# Patient Record
Sex: Female | Born: 1975 | Race: White | Hispanic: No | Marital: Married
Health system: Southern US, Community
[De-identification: ages and names within clinical notes are randomized; demographics above are authoritative.]

## PROBLEM LIST (undated history)

## (undated) DIAGNOSIS — G43909 Migraine, unspecified, not intractable, without status migrainosus: Secondary | ICD-10-CM

---

## 2003-11-11 ENCOUNTER — Observation Stay (HOSPITAL_COMMUNITY): Admission: AD | Admit: 2003-11-11 | Discharge: 2003-11-12 | Payer: Self-pay | Admitting: Otolaryngology

## 2004-10-05 ENCOUNTER — Encounter: Admission: RE | Admit: 2004-10-05 | Discharge: 2004-10-05 | Payer: Self-pay | Admitting: Obstetrics and Gynecology

## 2006-01-23 ENCOUNTER — Other Ambulatory Visit: Admission: RE | Admit: 2006-01-23 | Discharge: 2006-01-23 | Payer: Self-pay | Admitting: Obstetrics and Gynecology

## 2006-11-21 IMAGING — US US MD EVAL AND MANAGEMENT - NRPT [facility]
1 series · 13 of 16 positions shown · non-contrast
Comparison: none

[Series 1: unknown · 13 of 42 slices shown]
[im 1/42]
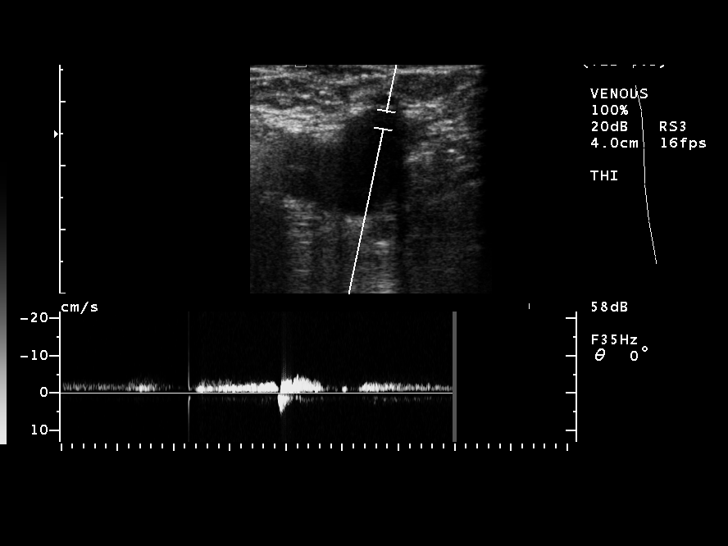
[im 3/42]
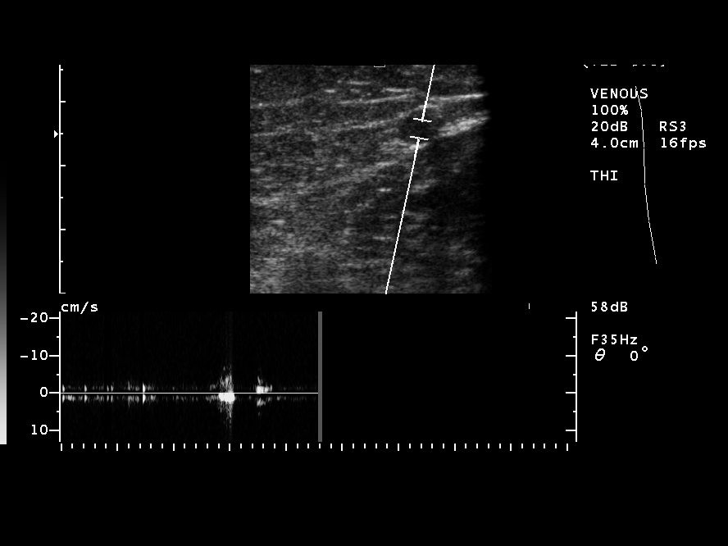
[im 9/42]
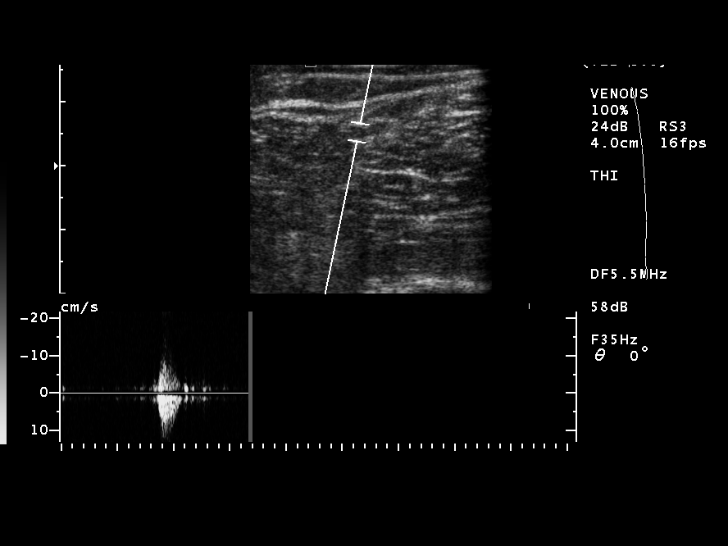
[im 11/42]
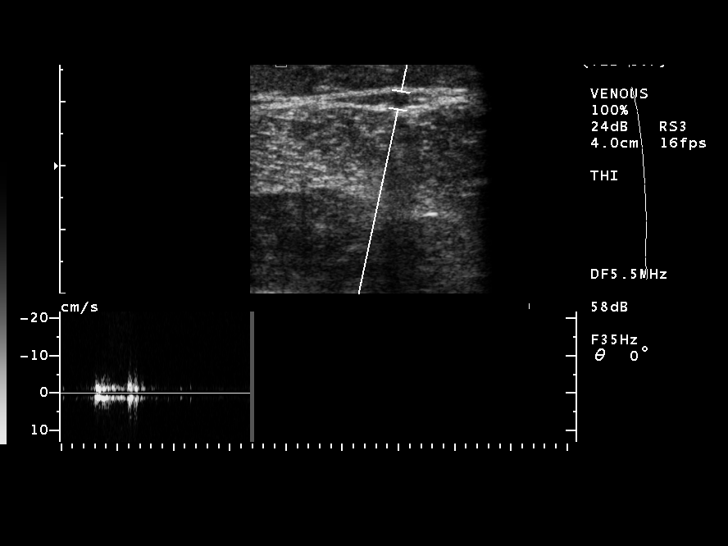
[im 14/42]
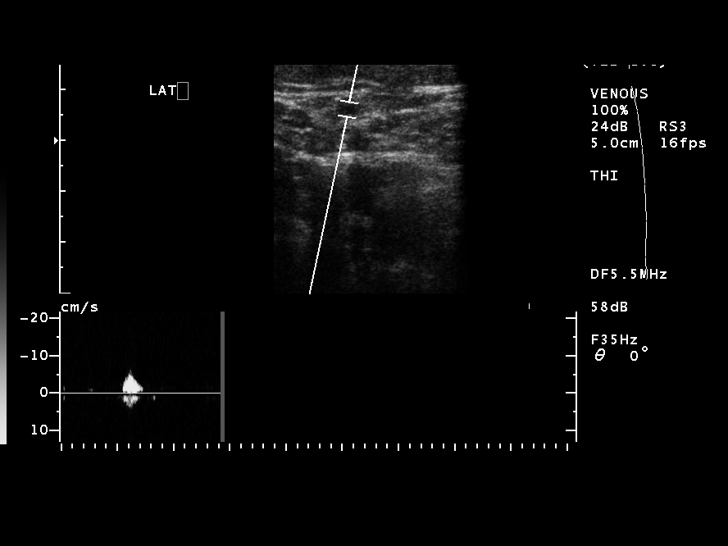
[im 17/42]
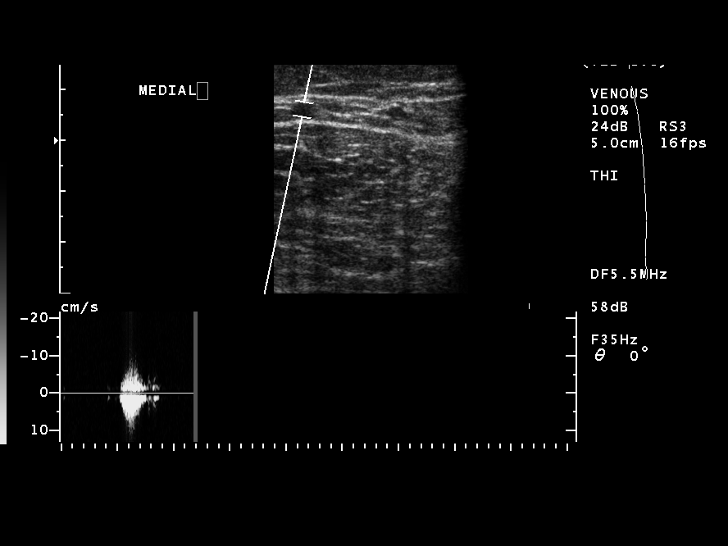
[im 22/42]
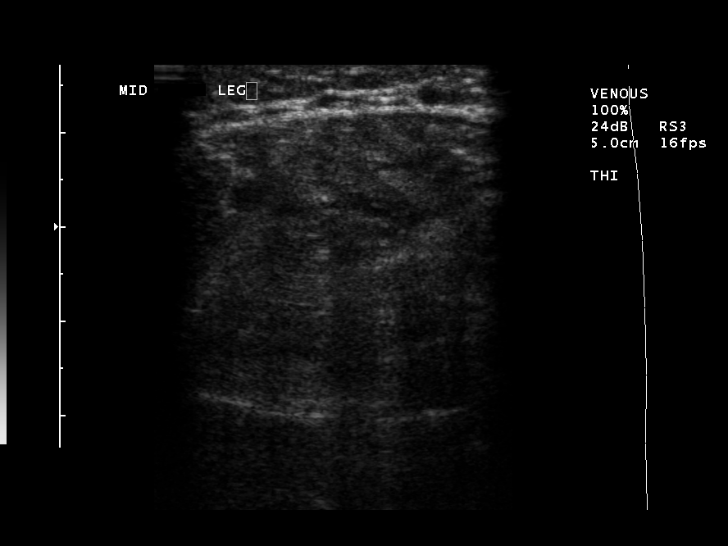
[im 25/42]
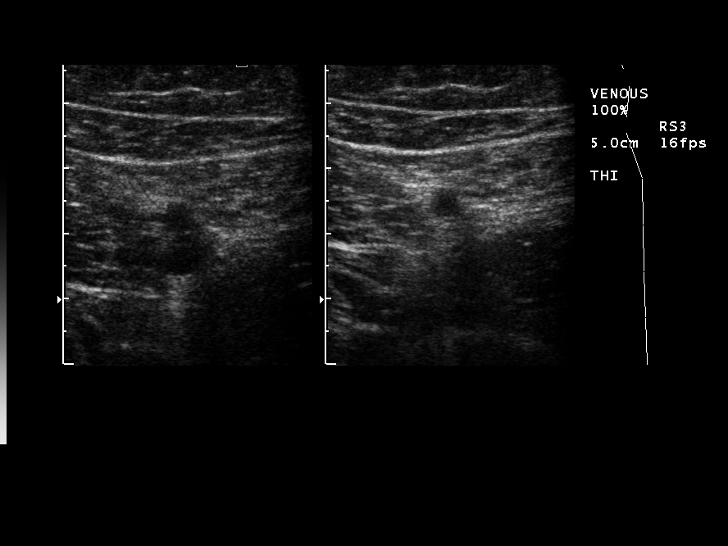
[im 28/42]
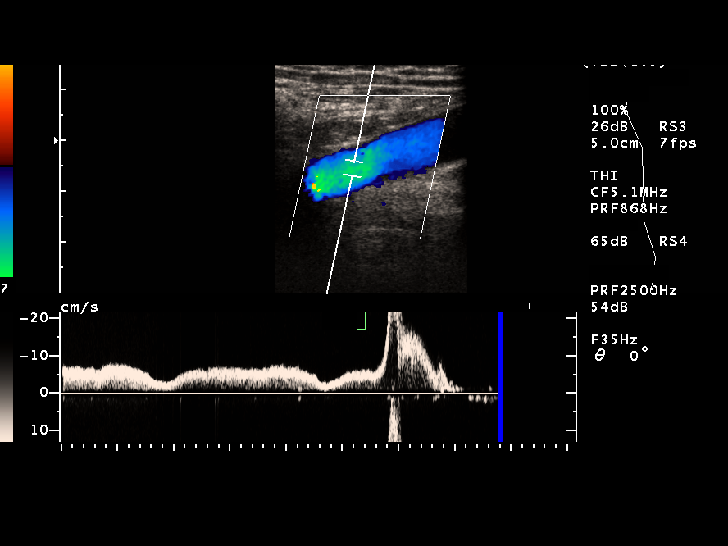
[im 31/42]
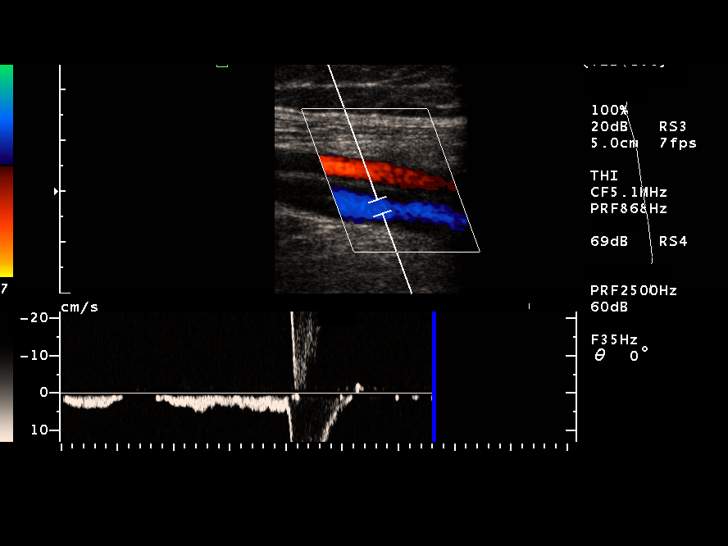
[im 33/42]
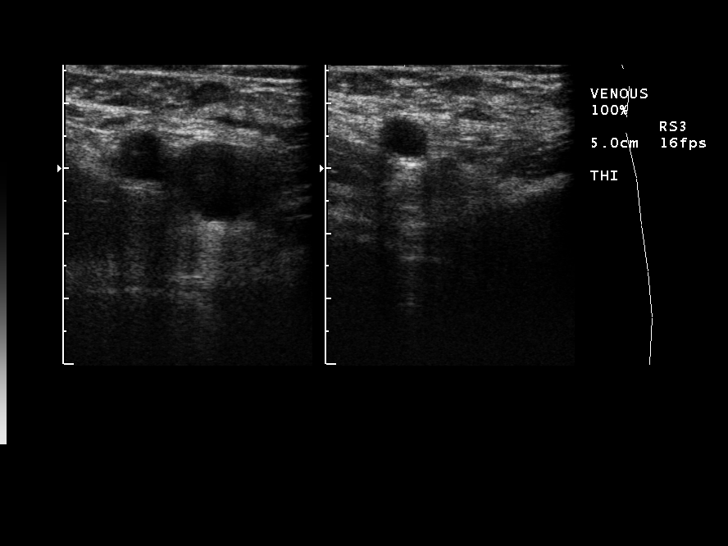
[im 39/42]
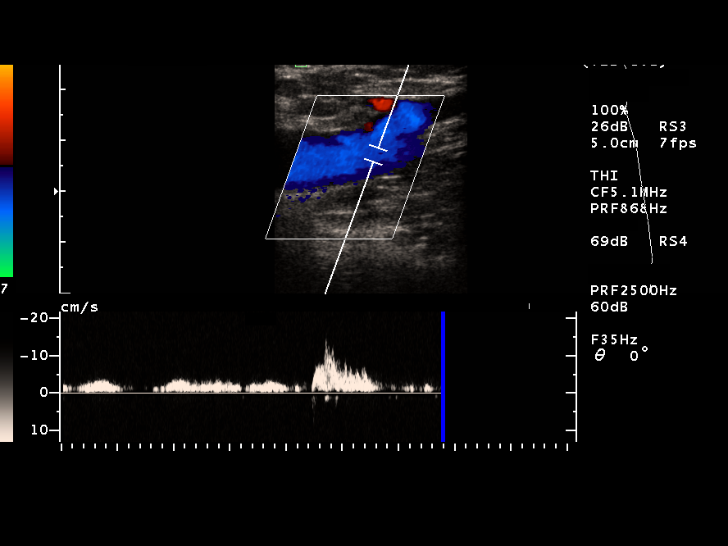
[im 42/42]
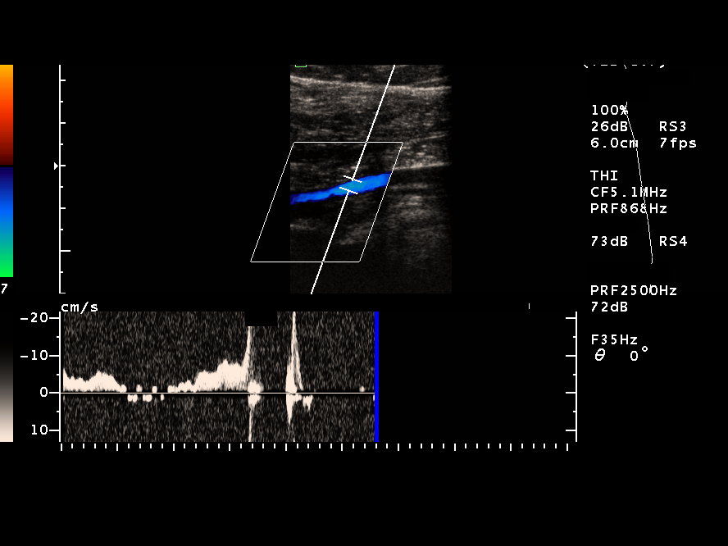

[13 of 16 positions shown; findings below may reference images not displayed]

OUTPATIENT CONSULTATION ? LEVEL II ? [DATE]
 Erick W Nery, M.D.
 [REDACTED]
 RE:  Savio Locklear (DOB ? [birth year])
 Dear Dr. Tomek Momin:  
 Thank you for your referral of Ms. Ardito to me for evaluation of possible lower extremity venous disease.  As you know, Ms. Robbin is a 29 year old female, who describes worsening pain within her calves bilaterally, left worse than right.  The patient?s job requires her to stand for long periods of time, and the patient states that her symptoms worsen throughout the day.  These symptoms have been worsening over the last several months.  The patient denies leg swelling.
 On physical exam, only two small spider veins are noted on the posterior left thigh and calf.  No varicosity is noted in either leg.  
 Due to the patient?s calf pain, I felt it was important to exclude venous disease as a possible cause.  Therefore, the patient underwent bilateral lower extremity venous ultrasound.  This was dictated in a separate report.  In short, this shows no evidence of venous disease.  There is no evidence of reflux or thrombosis. 
 I discussed these results with the patient, and insured her that venous disease is not the cause of her pain.  Therefore, no treatment directed at the patient?s veins is necessary at this time.  Certainly, if the patient feels that her spider veins are a cosmetic issue, these can be treated with sclerotherapy. 
 Again thank you for your referral of Ms. Robbin to me for evaluation.  If I can be of further assistance in the future, please do not hesitate to contact me. 
 I spent approximately 20 minutes in direct consultation with the patient. 
 Sincerely,
 KGD:chc

## 2007-01-28 ENCOUNTER — Other Ambulatory Visit: Admission: RE | Admit: 2007-01-28 | Discharge: 2007-01-28 | Payer: Self-pay | Admitting: Obstetrics and Gynecology

## 2008-02-05 ENCOUNTER — Other Ambulatory Visit: Admission: RE | Admit: 2008-02-05 | Discharge: 2008-02-05 | Payer: Self-pay | Admitting: Obstetrics and Gynecology

## 2008-03-31 ENCOUNTER — Ambulatory Visit (HOSPITAL_COMMUNITY): Admission: RE | Admit: 2008-03-31 | Discharge: 2008-03-31 | Payer: Self-pay | Admitting: Obstetrics and Gynecology

## 2009-02-22 ENCOUNTER — Other Ambulatory Visit: Admission: RE | Admit: 2009-02-22 | Discharge: 2009-02-22 | Payer: Self-pay | Admitting: Obstetrics and Gynecology

## 2010-05-17 IMAGING — RF DG HYSTEROGRAM
3 series · 3 of 3 positions shown · non-contrast
Comparison: none

CLINICAL DATA: Status post Essure micro-insert placement for
contraception.  Evaluate micro-insert location and tubal patency.

HYSTEROSALPINGOGRAM
TECHNIQUE: Hysterosalpingogram was performed by the ordering
physician.  Fluoroscopic images were submitted for interpretation
following the procedure.
Fluoroscopy time: 0.4 minutes

[Series 1: run · 1 of 1 slices shown (1 of 3)]
[im 1/1]
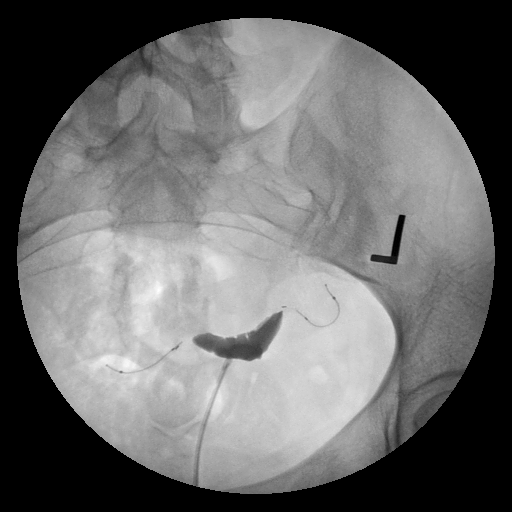

[Series 2: run · 1 of 1 slices shown (2 of 3)]
[im 1/1]
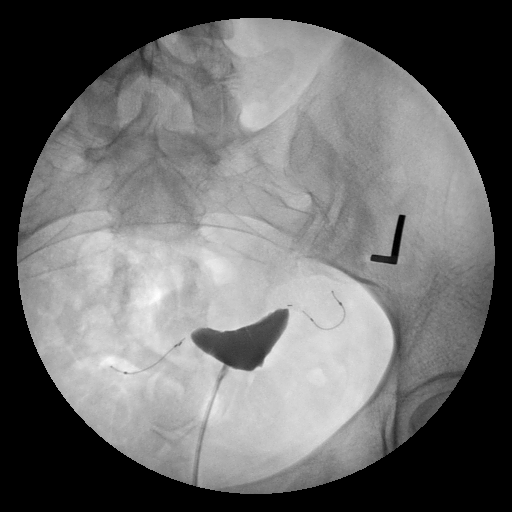

[Series 3: run · 1 of 1 slices shown (3 of 3)]
[im 1/1]
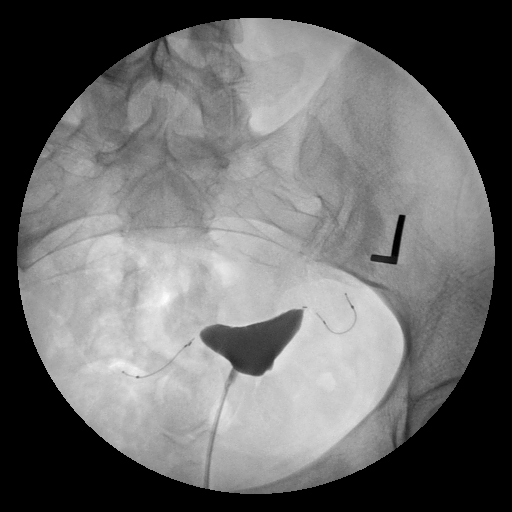

[3 of 3 positions shown; findings below may reference images not displayed]

FINDINGS: The Essure microinserts are seen in satisfactory
location within both of the fallopian tubes.

No contrast is seen opacifying either fallopian tube.  Both
fallopian tubes are occluded at the cornua (category 1).
IMPRESSION: Both Essure micro-inserts are in satisfactory location, and both
fallopian tubes are occluded at the cornua (category 1).

## 2010-05-23 ENCOUNTER — Other Ambulatory Visit (HOSPITAL_COMMUNITY)
Admission: RE | Admit: 2010-05-23 | Discharge: 2010-05-23 | Disposition: A | Discharge status: Home / Self Care | Payer: PRIVATE HEALTH INSURANCE | Source: Ambulatory Visit | Attending: Obstetrics and Gynecology | Admitting: Obstetrics and Gynecology

## 2010-05-23 ENCOUNTER — Other Ambulatory Visit: Payer: Self-pay | Admitting: Nurse Practitioner

## 2010-05-23 DIAGNOSIS — Z1159 Encounter for screening for other viral diseases: Secondary | ICD-10-CM | POA: Insufficient documentation

## 2010-05-23 DIAGNOSIS — Z01419 Encounter for gynecological examination (general) (routine) without abnormal findings: Secondary | ICD-10-CM | POA: Insufficient documentation

## 2015-09-28 ENCOUNTER — Ambulatory Visit (HOSPITAL_COMMUNITY)
Admission: RE | Admit: 2015-09-28 | Discharge: 2015-09-28 | Disposition: A | Discharge status: Home / Self Care | Payer: PRIVATE HEALTH INSURANCE | Attending: Psychiatry | Admitting: Psychiatry

## 2015-09-28 ENCOUNTER — Encounter (HOSPITAL_COMMUNITY): Payer: Self-pay | Admitting: Emergency Medicine

## 2015-09-28 DIAGNOSIS — F411 Generalized anxiety disorder: Secondary | ICD-10-CM | POA: Diagnosis present

## 2015-09-28 DIAGNOSIS — G43909 Migraine, unspecified, not intractable, without status migrainosus: Secondary | ICD-10-CM | POA: Insufficient documentation

## 2015-09-28 HISTORY — DX: Migraine, unspecified, not intractable, without status migrainosus: G43.909

## 2015-09-28 NOTE — BH Assessment (Addendum)
Assessment Note  Adrienne Garza is a 40 y.o. female who [facility] due to "severe anxiety and depression". Pt reports having relational issues with her 38 yr old stepdaughter and not being able to be settled in the fact that they have relational issues. Pt denies SI, HI, AVH or any hx of either. Pt denies any drug/alcohol use or psych hx. Pt is referred for OP therapy/psychiatry.   Diagnosis: GAD  Past Medical History:  Past Medical History:  Diagnosis Date  . Migraines     No past surgical history on file.  Family History: No family history on file.  Social History:  reports that she does not use drugs. Her tobacco and alcohol histories are not on file.  Additional Social History:  Alcohol / Drug Use Pain Medications: see PTA meds Prescriptions: see PTA meds Over the Counter: see PTA meds History of alcohol / drug use?: No history of alcohol / drug abuse  CIWA:   COWS:    Allergies: Allergies not on file  Home Medications:  (Not in a hospital admission)  OB/GYN Status:  No LMP recorded.  General Assessment Data [facility] TTS Assessment: In system Is this a Tele or Face-to-Face Assessment?: Face-to-Face Is this an Initial Assessment or a Re-assessment for this encounter?: Initial Assessment Marital status: Married Midway[place]iden name: Genis Is patient pregnant?: No Pregnancy Status: No Living Arrangements: Spouse/significant other, Other relatives (step-daughter) Can pt return to current living arrangement?: Yes Admission Status: Voluntary Is patient capable of signing voluntary admission?: Yes Referral Source: Self/Family/Friend Insurance type: [employer]  Medical Screening Exam [facility] Walk[facility] ONLY) Medical Exam completed: Yes  Crisis Care Plan Living Arrangements: Spouse/significant other, Other relatives (step-daughter) Name of Psychiatrist: none Name of Therapist: none  Education Status Is patient  currently in school?: No  Risk to self with the past 6 months Suicidal Ideation: No Has patient been a risk to self within the past 6 months prior to admission? : No Suicidal Intent: No Has patient had any suicidal intent within the past 6 months prior to admission? : No Is patient at risk for suicide?: No Suicidal Plan?: No Has patient had any suicidal plan within the past 6 months prior to admission? : No Access to Means: No What has been your use of drugs/alcohol within the last 12 months?: pt denies Previous Attempts/Gestures: No Intentional Self Injurious Behavior: None Family Suicide History: Unknown Recent stressful life event(s): Conflict (Comment) (w/ stepdaughter) Persecutory voices/beliefs?: No Depression: Yes Depression Symptoms: Tearfulness, Isolating, Feeling angry/irritable Substance abuse history and/or treatment for substance abuse?: No Suicide prevention information given to non-admitted patients: Not applicable  Risk to Others within the past 6 months Homicidal Ideation: No Does patient have any lifetime risk of violence toward others beyond the six months prior to admission? : No Thoughts of Harm to Others: No Current Homicidal Intent: No Current Homicidal Plan: No Access to Homicidal Means: No History of harm to others?: No Assessment of Violence: None Noted Does patient have access to weapons?: No Criminal Charges Pending?: No Does patient have a court date: No Is patient on probation?: No  Psychosis Hallucinations: None noted Delusions: None noted  Mental Status Report Appearance/Hygiene: Unremarkable Eye Contact: Fair Motor Activity: Unremarkable Speech: Logical/coherent Level of Consciousness: Crying, Alert Mood: Anxious, Sad Affect: Appropriate to circumstance Anxiety Level: Panic Attacks Panic attack frequency: 2 or 3xs/day Most recent panic attack: this morning (09/28/2015) Thought Processes: Coherent, Relevant Judgement:  Unimpaired  Orientation: Person, Time, Situation, Place, Appropriate for developmental age Obsessive Compulsive Thoughts/Behaviors: None  Cognitive Functioning Concentration: Normal Memory: Recent Intact, Remote Intact IQ: Average Insight: Good Impulse Control: Good Appetite: Poor Sleep: Decreased Total Hours of Sleep: 2 Vegetative Symptoms: None  ADLScreening [facility] Assessment Services) Patient's cognitive ability adequate to safely complete daily activities?: Yes Patient able to express need for assistance with ADLs?: Yes Independently performs ADLs?: Yes (appropriate for developmental age)  Prior Inpatient Therapy Prior Inpatient Therapy: No  Prior Outpatient Therapy Prior Outpatient Therapy: No Does patient have an ACCT team?: No Does patient have Intensive In-House Services?  : No Does patient have [facility] services? : No Does patient have P4CC services?: No  ADL Screening (condition at time of admission) Patient's cognitive ability adequate to safely complete daily activities?: Yes Is the patient deaf or have difficulty hearing?: No Does the patient have difficulty seeing, even when wearing glasses/contacts?: No Does the patient have difficulty concentrating, remembering, or making decisions?: No Patient able to express need for assistance with ADLs?: Yes Does the patient have difficulty dressing or bathing?: No Independently performs ADLs?: Yes (appropriate for developmental age) Does the patient have difficulty walking or climbing stairs?: No Weakness of Legs: None Weakness of Arms/Hands: None  Home Assistive Devices/Equipment Home Assistive Devices/Equipment: None  Therapy Consults (therapy consults require a physician order) PT Evaluation Needed: No OT Evalulation Needed: No SLP Evaluation Needed: No Abuse/Neglect Assessment (Assessment to be complete while patient is alone) Physical Abuse: Denies Verbal Abuse: Denies Sexual Abuse: Denies Exploitation of  patient/patient's resources: Denies Self-Neglect: Denies Values / Beliefs Cultural Requests During Hospitalization: None Spiritual Requests During Hospitalization: None Consults Spiritual Care Consult Needed: No Social Work Consult Needed: No Merchant navy officer (For Healthcare) Does patient have an advance directive?: No Would patient like information on creating an advanced directive?: No - patient declined information    Additional Information 1:1 In Past 12 Months?: No CIRT Risk: No Elopement Risk: No Does patient have medical clearance?: Yes     Disposition:  Disposition Initial Assessment Completed for this Encounter: Yes (consulted with Malachy Chamber, NP) Disposition of Patient: Other dispositions (Pt given resources for OP therapy and psychiatry) Other disposition(s): Information only  On Site Evaluation by:   Reviewed with Physician:    Laddie Aquas 09/28/2015 11:37 AM

## 2015-09-28 NOTE — H&P (Signed)
Behavioral Health Medical Screening Exam  Adrienne Garza is an 40 y.o. female.  Total Time spent with patient: 20 minutes  Psychiatric Specialty Exam: Physical Exam  Nursing note and vitals reviewed. Constitutional: She is oriented to person, place, and time. She appears well-developed.  HENT:  Head: Normocephalic.  Eyes: Pupils are equal, round, and reactive to light.  Neck: Normal range of motion.  Cardiovascular: Normal rate.   Respiratory: Effort normal.  Musculoskeletal: Normal range of motion.  Neurological: She is alert and oriented to person, place, and time.  Psychiatric: She has a normal mood and affect. Her behavior is normal. Judgment and thought content normal.    Review of Systems  Psychiatric/Behavioral: Negative for depression, hallucinations, memory loss, substance abuse and suicidal ideas. The patient is nervous/anxious. The patient does not have insomnia.   All other systems reviewed and are negative.   There were no vitals taken for this visit.There is no height or weight on file to calculate BMI.  General Appearance: Casual and Fairly Groomed  Eye Contact:  Fair  Speech:  Clear and Coherent and Normal Rate  Volume:  Normal  Mood:  Anxious  Affect:  Appropriate and Congruent  Thought Process:  Goal Directed  Orientation:  Full (Time, Place, and Person)  Thought Content:  WDL  Suicidal Thoughts:  No  Homicidal Thoughts:  No  Memory:  Immediate;   Fair  Judgement:  Intact  Insight:  Fair and Present  Psychomotor Activity:  Normal  Concentration: Concentration: Good  Recall:  Good  Fund of Knowledge:Good  Language: Good  Akathisia:  No  Handed:  Right  AIMS (if indicated):     Assets:  Communication Skills Desire for Improvement Housing Leisure Time Physical Health Social Support Transportation Vocational/Educational  Sleep:       Musculoskeletal: Strength & Muscle Tone: within normal limits Gait & Station: normal Patient leans: N/A  There  were no vitals taken for this visit.  Recommendations:  Based on my evaluation the patient does not appear to have an emergency medical condition. Patient provided a list of outpatient resources. She declined IOP. She was advised for immediate treatment she can be seen at PCP for prn prescription of medication to help with anxiety if she feels one is needed. Assisted patient and husband to identify wlak-in clinics from the 3 lists that were provided. They both verbalized understanding. It was furthered explained to patient that she may benefit from family therapy for conflict resolution and communication skills. She is advised this a crisis stabilizaiton and acute hospitalization facility, and her symptoms do not meet critieria, and we are unable to prescribe a presctription for her symptoms at this time. She continues to deny SI/Hi/AVH.   Adrienne Haywardakia S [name], FNP 09/28/2015, 11:47 AM
